# Patient Record
Sex: Male | Born: 1970 | Hispanic: No | Marital: Married | Smoking: Never smoker
Health system: Southern US, Community
[De-identification: ages and names within clinical notes are randomized; demographics above are authoritative.]

## PROBLEM LIST (undated history)

## (undated) HISTORY — PX: DG FOOT RIGHT COMPLETE (ARMC HX): HXRAD1522

## (undated) HISTORY — PX: APPENDECTOMY: SHX54

---

## 1998-11-27 ENCOUNTER — Inpatient Hospital Stay (HOSPITAL_COMMUNITY): Admission: EM | Admit: 1998-11-27 | Discharge: 1998-11-29 | Payer: Self-pay

## 2015-09-13 ENCOUNTER — Emergency Department (HOSPITAL_COMMUNITY): Payer: Worker's Compensation

## 2015-09-13 ENCOUNTER — Observation Stay (HOSPITAL_COMMUNITY)
Admission: EM | Admit: 2015-09-13 | Discharge: 2015-09-14 | Disposition: A | Discharge status: Home / Self Care | Payer: Worker's Compensation | Attending: Oncology | Admitting: Oncology

## 2015-09-13 DIAGNOSIS — T22091A Burn of unspecified degree of multiple sites of right shoulder and upper limb, except wrist and hand, initial encounter: Secondary | ICD-10-CM | POA: Insufficient documentation

## 2015-09-13 DIAGNOSIS — T754XXA Electrocution, initial encounter: Secondary | ICD-10-CM | POA: Diagnosis present

## 2015-09-13 DIAGNOSIS — T310 Burns involving less than 10% of body surface: Secondary | ICD-10-CM | POA: Insufficient documentation

## 2015-09-13 DIAGNOSIS — W868XXA Exposure to other electric current, initial encounter: Secondary | ICD-10-CM | POA: Insufficient documentation

## 2015-09-13 DIAGNOSIS — T22092A Burn of unspecified degree of multiple sites of left shoulder and upper limb, except wrist and hand, initial encounter: Secondary | ICD-10-CM | POA: Diagnosis not present

## 2015-09-13 DIAGNOSIS — T1490 Injury, unspecified: Secondary | ICD-10-CM | POA: Diagnosis not present

## 2015-09-13 DIAGNOSIS — T24092A Burn of unspecified degree of multiple sites of left lower limb, except ankle and foot, initial encounter: Secondary | ICD-10-CM | POA: Diagnosis not present

## 2015-09-13 DIAGNOSIS — W85XXXA Exposure to electric transmission lines, initial encounter: Secondary | ICD-10-CM | POA: Diagnosis not present

## 2015-09-13 DIAGNOSIS — E876 Hypokalemia: Secondary | ICD-10-CM | POA: Diagnosis not present

## 2015-09-13 DIAGNOSIS — T24091A Burn of unspecified degree of multiple sites of right lower limb, except ankle and foot, initial encounter: Secondary | ICD-10-CM | POA: Diagnosis not present

## 2015-09-13 DIAGNOSIS — T1490XA Injury, unspecified, initial encounter: Secondary | ICD-10-CM

## 2015-09-13 DIAGNOSIS — M25511 Pain in right shoulder: Secondary | ICD-10-CM | POA: Insufficient documentation

## 2015-09-13 DIAGNOSIS — IMO0002 Reserved for concepts with insufficient information to code with codable children: Secondary | ICD-10-CM

## 2015-09-13 DIAGNOSIS — Y93H3 Activity, building and construction: Secondary | ICD-10-CM | POA: Diagnosis not present

## 2015-09-13 DIAGNOSIS — R911 Solitary pulmonary nodule: Secondary | ICD-10-CM | POA: Diagnosis not present

## 2015-09-13 DIAGNOSIS — Z23 Encounter for immunization: Secondary | ICD-10-CM | POA: Diagnosis not present

## 2015-09-13 LAB — URINALYSIS, ROUTINE W REFLEX MICROSCOPIC
BILIRUBIN URINE: NEGATIVE
Glucose, UA: NEGATIVE mg/dL
HGB URINE DIPSTICK: NEGATIVE
KETONES UR: NEGATIVE mg/dL
Leukocytes, UA: NEGATIVE
NITRITE: NEGATIVE
PH: 7 (ref 5.0–8.0)
Protein, ur: NEGATIVE mg/dL
SPECIFIC GRAVITY, URINE: 1.043 — AB (ref 1.005–1.030)

## 2015-09-13 LAB — COMPREHENSIVE METABOLIC PANEL
ALT: 27 U/L (ref 17–63)
AST: 31 U/L (ref 15–41)
Albumin: 4.1 g/dL (ref 3.5–5.0)
Alkaline Phosphatase: 77 U/L (ref 38–126)
Anion gap: 11 (ref 5–15)
BILIRUBIN TOTAL: 0.6 mg/dL (ref 0.3–1.2)
BUN: 11 mg/dL (ref 6–20)
CHLORIDE: 105 mmol/L (ref 101–111)
CO2: 25 mmol/L (ref 22–32)
CREATININE: 0.73 mg/dL (ref 0.61–1.24)
Calcium: 9.4 mg/dL (ref 8.9–10.3)
Glucose, Bld: 131 mg/dL — ABNORMAL HIGH (ref 65–99)
POTASSIUM: 3.3 mmol/L — AB (ref 3.5–5.1)
Sodium: 141 mmol/L (ref 135–145)
TOTAL PROTEIN: 7.2 g/dL (ref 6.5–8.1)

## 2015-09-13 LAB — PROTIME-INR
INR: 0.94 (ref 0.00–1.49)
PROTHROMBIN TIME: 12.8 s (ref 11.6–15.2)

## 2015-09-13 LAB — CK TOTAL AND CKMB (NOT AT ARMC)
CK, MB: 3.4 ng/mL (ref 0.5–5.0)
Relative Index: 1.3 (ref 0.0–2.5)
Total CK: 272 U/L (ref 49–397)

## 2015-09-13 LAB — CBC
HEMATOCRIT: 43.3 % (ref 39.0–52.0)
Hemoglobin: 15.5 g/dL (ref 13.0–17.0)
MCH: 29.2 pg (ref 26.0–34.0)
MCHC: 35.8 g/dL (ref 30.0–36.0)
MCV: 81.5 fL (ref 78.0–100.0)
PLATELETS: 195 10*3/uL (ref 150–400)
RBC: 5.31 MIL/uL (ref 4.22–5.81)
RDW: 13.7 % (ref 11.5–15.5)
WBC: 8 10*3/uL (ref 4.0–10.5)

## 2015-09-13 LAB — I-STAT TROPONIN, ED: TROPONIN I, POC: 0 ng/mL (ref 0.00–0.08)

## 2015-09-13 LAB — I-STAT CHEM 8, ED
BUN: 12 mg/dL (ref 6–20)
CALCIUM ION: 1.15 mmol/L (ref 1.12–1.23)
CREATININE: 0.6 mg/dL — AB (ref 0.61–1.24)
Chloride: 104 mmol/L (ref 101–111)
GLUCOSE: 128 mg/dL — AB (ref 65–99)
HEMATOCRIT: 48 % (ref 39.0–52.0)
HEMOGLOBIN: 16.3 g/dL (ref 13.0–17.0)
Potassium: 3.1 mmol/L — ABNORMAL LOW (ref 3.5–5.1)
Sodium: 142 mmol/L (ref 135–145)
TCO2: 23 mmol/L (ref 0–100)

## 2015-09-13 LAB — ETHANOL: Alcohol, Ethyl (B): <5 mg/dL (ref <5)

## 2015-09-13 LAB — CDS SEROLOGY

## 2015-09-13 MED ORDER — OXYCODONE-ACETAMINOPHEN 5-325 MG PO TABS
1.0000 | ORAL_TABLET | Freq: Four times a day (QID) | ORAL | Status: DC | PRN
  Administered 2015-09-13: 2 via ORAL
  Filled 2015-09-13: qty 2

## 2015-09-13 MED ORDER — SODIUM CHLORIDE 0.9 % IV SOLN
INTRAVENOUS | Status: DC
  Administered 2015-09-13 – 2015-09-14 (×2): via INTRAVENOUS

## 2015-09-13 MED ORDER — TETANUS-DIPHTH-ACELL PERTUSSIS 5-2.5-18.5 LF-MCG/0.5 IM SUSP
0.5000 mL | Freq: Once | INTRAMUSCULAR | Status: AC
  Administered 2015-09-13: 0.5 mL via INTRAMUSCULAR

## 2015-09-13 MED ORDER — POTASSIUM CHLORIDE CRYS ER 20 MEQ PO TBCR
40.0000 meq | EXTENDED_RELEASE_TABLET | Freq: Once | ORAL | Status: AC
  Administered 2015-09-13: 40 meq via ORAL
  Filled 2015-09-13: qty 2

## 2015-09-13 MED ORDER — SILVER SULFADIAZINE 1 % EX CREA
TOPICAL_CREAM | Freq: Every day | CUTANEOUS | Status: DC
  Administered 2015-09-13: 1 via TOPICAL
  Filled 2015-09-13: qty 85

## 2015-09-13 MED ORDER — MORPHINE SULFATE (PF) 4 MG/ML IV SOLN
4.0000 mg | Freq: Once | INTRAVENOUS | Status: AC
  Administered 2015-09-13: 4 mg via INTRAVENOUS
  Filled 2015-09-13: qty 1

## 2015-09-13 MED ORDER — TETANUS-DIPHTHERIA TOXOIDS TD 5-2 LFU IM INJ
0.5000 mL | INJECTION | Freq: Once | INTRAMUSCULAR | Status: DC
  Filled 2015-09-13: qty 0.5

## 2015-09-13 MED ORDER — IOHEXOL 300 MG/ML  SOLN
100.0000 mL | Freq: Once | INTRAMUSCULAR | Status: AC | PRN
  Administered 2015-09-13: 100 mL via INTRAVENOUS

## 2015-09-13 MED ORDER — BACITRACIN ZINC 500 UNIT/GM EX OINT
TOPICAL_OINTMENT | Freq: Two times a day (BID) | CUTANEOUS | Status: DC
  Administered 2015-09-13 (×2): 1 via TOPICAL
  Administered 2015-09-14: 11:00:00 via TOPICAL
  Filled 2015-09-13 (×2): qty 28.35
  Filled 2015-09-13: qty 2.7
  Filled 2015-09-13 (×2): qty 28.35

## 2015-09-13 MED ORDER — ENOXAPARIN SODIUM 40 MG/0.4ML ~~LOC~~ SOLN
40.0000 mg | SUBCUTANEOUS | Status: DC
  Administered 2015-09-13: 40 mg via SUBCUTANEOUS
  Filled 2015-09-13: qty 0.4

## 2015-09-13 MED ORDER — POTASSIUM CHLORIDE 10 MEQ/100ML IV SOLN
10.0000 meq | Freq: Once | INTRAVENOUS | Status: AC
  Administered 2015-09-13: 10 meq via INTRAVENOUS
  Filled 2015-09-13: qty 100

## 2015-09-13 MED ORDER — OXYCODONE-ACETAMINOPHEN 5-325 MG PO TABS
2.0000 | ORAL_TABLET | Freq: Once | ORAL | Status: AC
  Administered 2015-09-13: 2 via ORAL
  Filled 2015-09-13: qty 2

## 2015-09-13 MED ORDER — ACETAMINOPHEN 650 MG RE SUPP: 650.0000 mg | Freq: Four times a day (QID) | RECTAL | Status: DC | PRN

## 2015-09-13 MED ORDER — ACETAMINOPHEN 325 MG PO TABS: 650.0000 mg | ORAL_TABLET | Freq: Four times a day (QID) | ORAL | Status: DC | PRN
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
## 2015-09-13 NOTE — ED Notes (Signed)
Attempted report 
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
## 2015-09-13 NOTE — ED Notes (Signed)
Regular meal tray ordered for pt @ 16:44

## 2015-09-13 NOTE — Progress Notes (Signed)
[facility] [place] Pt to be admitted to room 5w34.

## 2015-09-14 ENCOUNTER — Encounter (HOSPITAL_COMMUNITY): Payer: Self-pay | Admitting: *Deleted

## 2015-09-14 DIAGNOSIS — W85XXXA Exposure to electric transmission lines, initial encounter: Secondary | ICD-10-CM | POA: Diagnosis not present

## 2015-09-14 DIAGNOSIS — Y990 Civilian activity done for income or pay: Secondary | ICD-10-CM

## 2015-09-14 DIAGNOSIS — T310 Burns involving less than 10% of body surface: Secondary | ICD-10-CM | POA: Insufficient documentation

## 2015-09-14 DIAGNOSIS — IMO0002 Reserved for concepts with insufficient information to code with codable children: Secondary | ICD-10-CM | POA: Insufficient documentation

## 2015-09-14 DIAGNOSIS — Y93H3 Activity, building and construction: Secondary | ICD-10-CM

## 2015-09-14 DIAGNOSIS — T754XXA Electrocution, initial encounter: Secondary | ICD-10-CM

## 2015-09-14 LAB — CBC
HEMATOCRIT: 41.1 % (ref 39.0–52.0)
HEMOGLOBIN: 14.2 g/dL (ref 13.0–17.0)
MCH: 29.1 pg (ref 26.0–34.0)
MCHC: 34.5 g/dL (ref 30.0–36.0)
MCV: 84.2 fL (ref 78.0–100.0)
Platelets: 193 10*3/uL (ref 150–400)
RBC: 4.88 MIL/uL (ref 4.22–5.81)
RDW: 14.1 % (ref 11.5–15.5)
WBC: 7.6 10*3/uL (ref 4.0–10.5)

## 2015-09-14 LAB — CK: Total CK: 467 U/L — ABNORMAL HIGH (ref 49–397)

## 2015-09-14 LAB — BASIC METABOLIC PANEL
ANION GAP: 8 (ref 5–15)
BUN: 10 mg/dL (ref 6–20)
CALCIUM: 8.7 mg/dL — AB (ref 8.9–10.3)
CO2: 25 mmol/L (ref 22–32)
Chloride: 104 mmol/L (ref 101–111)
Creatinine, Ser: 0.73 mg/dL (ref 0.61–1.24)
GFR calc Af Amer: >60 mL/min (ref >60)
GFR calc non Af Amer: >60 mL/min (ref >60)
GLUCOSE: 100 mg/dL — AB (ref 65–99)
POTASSIUM: 4.4 mmol/L (ref 3.5–5.1)
Sodium: 137 mmol/L (ref 135–145)

## 2015-09-14 MED ORDER — SILVER SULFADIAZINE 1 % EX CREA
TOPICAL_CREAM | Freq: Two times a day (BID) | CUTANEOUS | Status: DC
  Administered 2015-09-14: 11:00:00 via TOPICAL
  Filled 2015-09-14: qty 85

## 2015-09-14 MED ORDER — BACITRACIN ZINC 500 UNIT/GM EX OINT: TOPICAL_OINTMENT | Freq: Two times a day (BID) | CUTANEOUS | Status: DC

## 2015-09-14 MED ORDER — OXYCODONE-ACETAMINOPHEN 5-325 MG PO TABS: 1.0000 | ORAL_TABLET | Freq: Four times a day (QID) | ORAL | Status: DC | PRN

## 2015-09-14 MED ORDER — SILVER SULFADIAZINE 1 % EX CREA: TOPICAL_CREAM | Freq: Two times a day (BID) | CUTANEOUS | Status: DC
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
## 2015-09-14 NOTE — Progress Notes (Signed)
Nsg Discharge Note  Admit Date:  09/13/2015 Discharge date: 09/14/2015   Jerome Potts to be D/C'd Home per MD order.  AVS completed.  Copy for chart, and copy for patient signed, and dated. Patient/caregiver able to verbalize understanding.  Discharge Medication:   Medication List    TAKE these medications        bacitracin ointment  Apply topically 2 (two) times daily.     oxyCODONE-acetaminophen 5-325 MG tablet  Commonly known as:  PERCOCET/ROXICET  Take 1-2 tablets by mouth every 6 (six) hours as needed for moderate pain or severe pain.     silver sulfADIAZINE 1 % cream  Commonly known as:  SILVADENE  Apply topically 2 (two) times daily.        Discharge Assessment: Filed Vitals:   09/14/15 0529 09/14/15 1237  BP: 103/61 128/82  Pulse: 51 62  Temp: 97.8 F (36.6 C) 98.1 F (36.7 C)  Resp: 18 19  Skin clean, dry and intact without evidence of skin break down, no evidence of skin tears noted. IV catheter discontinued intact. Site without signs and symptoms of complications - no redness or edema noted at insertion site, patient denies c/o pain - only slight tenderness at site.  Dressing with slight pressure applied.  D/c Instructions-Education: Discharge instructions given to patient/family with verbalized understanding. D/c education completed with patient/family including follow up instructions, medication list, d/c activities limitations if indicated, with other d/c instructions as indicated by MD - patient able to verbalize understanding, all questions fully answered. Patient instructed to return to ED, call 911, or call MD for any changes in condition.  Patient escorted via WC, and D/C home via private auto.  Jerome FlamingVicki L Victoriano Campion, RN 09/14/2015 3:52 PM

## 2015-09-14 NOTE — Discharge Instructions (Signed)
Apply Bacitracin ointment to your hand wound twice daily and cover with dry dressing.  Apply silver silvadene cream to the wounds on your toes twice daily and cover with dry dressing.   You may take Percocet 5-325 mg: 1-2 tablets by mouth every 6 hours as needed for pain.
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
## 2015-09-14 NOTE — Care Management Note (Signed)
Case Management Note  Patient Details  Name: Army Fossalex Ernesto Vandrunen MRN: [id number] Date of Birth: 01/13/1971  Subjective/Objective:    Date: 09/14/15 Spoke with patient at the bedside.  Introduced self as Sports coachcase manager and explained role in discharge planning and how to be reached.  Verified patient lives in town, alone with spouse.  Verified patient anticipates to go home with family,  at time of discharge and will have full-time supervision by family at this time to best of their knowledge. Patient denied needing help with their medication, he will be on silvadence cream which is $68 .00, bacitracin which is otc $2.00 and oxycodone which is $12 at [facility],  [facility]  Patient  is driven by family member to MD appointments.  Verified patient will follow up with internal medicine clinic.   Plan: CM will continue to follow for discharge planning and [facility].                 Action/Plan:   Expected Discharge Date:                  Expected Discharge Plan:  Home/Self Care  In-House Referral:     Discharge planning Services  CM Consult  Post Acute Care Choice:    Choice offered to:     DME Arranged:    DME Agency:     HH Arranged:    HH Agency:     Status of Service:  Completed, signed off  Medicare Important Message Given:    Date Medicare IM Given:    Medicare IM give by:    Date Additional Medicare IM Given:    Additional Medicare Important Message give by:     If discussed at Long Length of Stay Meetings, dates discussed:    Additional Comments:  Leone Havenaylor, Bosten Newstrom [place], RN 09/14/2015, 12:00 PM

## 2015-09-29 ENCOUNTER — Ambulatory Visit (INDEPENDENT_AMBULATORY_CARE_PROVIDER_SITE_OTHER): Payer: Worker's Compensation | Admitting: Internal Medicine

## 2015-09-29 VITALS — BP 144/77 | HR 70 | Temp 98.1°F | Ht 66.0 in | Wt 169.0 lb

## 2015-09-29 DIAGNOSIS — IMO0002 Reserved for concepts with insufficient information to code with codable children: Secondary | ICD-10-CM

## 2015-09-29 DIAGNOSIS — R911 Solitary pulmonary nodule: Secondary | ICD-10-CM | POA: Insufficient documentation

## 2015-09-29 DIAGNOSIS — T300 Burn of unspecified body region, unspecified degree: Secondary | ICD-10-CM

## 2015-09-29 DIAGNOSIS — W868XXD Exposure to other electric current, subsequent encounter: Secondary | ICD-10-CM

## 2015-09-29 MED ORDER — OXYCODONE-ACETAMINOPHEN 5-325 MG PO TABS: 1.0000 | ORAL_TABLET | Freq: Four times a day (QID) | ORAL | Status: DC | PRN

## 2015-09-29 NOTE — Assessment & Plan Note (Signed)
During admission, CT Chest revealed a solitary right middle lobe 4 mm pulmonary nodule. If the patient is at high risk for bronchogenic carcinoma, follow-up chest CT at 1 year is recommended. If the patient is at low risk, no follow-up is needed. Patient denies a history of tobacco use. Patient is likely at low risk and does not need further evaluation.  Plan: -Re-address smoking history at follow up visit and again at 1 year
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
## 2015-09-29 NOTE — Patient Instructions (Signed)
CONTINUE CURRENT MANAGEMENT OF THE WOUNDS. KEEP RIGHT FOOT ELEVATED IF POSSIBLE. I HAVE SENT IN MORE PAIN MEDICATION.  1. [facility] MONDAY JANUARY 9TH AT 9:30 AM  [address]  [place] [id number] --LOCATED ON [address]-- [phone]  2. [facility] AM [address] [place] [place] [place] [address] [phone]  Karle BarrQuemadura Por Electricidad (Electrical Burn) Judye BosUna quemadura por electricidad puede ocasionar daos tanto a la piel como a los tejidos profundos. Si una quemadura elctrica es menor y slo afecta la piel, puede tratarse como cualquier [place]ra quemadura. Si han daado otro tejidos ms profundos (msculos, nervios, vasos sanguneos), podr necesitar cuidados hospitalarios. Las quemaduras por electricidad pueden llegar a las zonas ms profundas del cuerpo, Biomedical engineerpero los signos de [place] People[place] Democratic Republicquemadura en la piel pueden ser mnimos. Las quemaduras profundas pueden afectar el corazn y otros rganos vitales. Si se sufre una lesin importante por electricidad, pueden daarse significativamente los msculos (rabdomilisis).Puede causar dolor muscular, fatiga y espasmos. Tambin puede ocurrir que la orina se vuelva [place]ainted Postoscura, debido a los productos que provienen de las lesiones musculares. Solicite atencin mdica de inmediato si presenta alguno de estos sntomas: Las lesiones por electricidad tambin pueden causar traumatismos debido a una cada o al golpe. El resultado puede ser un traumatismo importante en una extremidad o en la cabeza, y causar prdida de la conciencia.  El grado de la lesin se relaciona con el tipo de corriente (alterna [CA] o directa [CD]) y la cantidad de voltaje (bajo o alto). La mayor parte de las lesiones por electricidad que ocurren en el hogar son por bajo voltaje de CA, pero es importante que le informe a su mdico el modo en que ocurri para ayudarlo a Physicist, medicaldeterminar el tratamiento.La mayora de las  quemaduras por electricidad no ponen en peligro en la vida, pero puede interrumpir el ritmo cardaco o la respiracin. INSTRUCCIONES PARA EL CUIDADO EN EL HOGAR   Mantener el rea quemada limpia y [place] Islandsseca.  Utilice vendajes, cremas y pomadas para quemaduras slo cuando se lo indique el mdico. Los vendajes debern cambiarse una o dos veces al da o como se le indique.  Coloque en reposo la zona afectada y mantngala elevada para reducir la inflamacin y Chief Technology Officerel dolor.  Tome todos los medicamentos segn le indic su mdico. SOLICITE ATENCIN MDICA DE INMEDIATO SI:  Presenta fiebre, enrojecimiento o segrega pus.  Siente falta de aire o Journalist, newspaperdolor en el pecho.  Siente que se le adormece o debilita alguna de las extremidades.  Siente un dolor intenso o hinchazn en alguna de las extremidades.  La orina se vuelve muy oscura, y tiene ms dolor, fatiga y espasmos musculares. ASEGRESE DE QUE:   Comprende estas instrucciones.  Controlar su enfermedad.  Solicitar ayuda de inmediato si no mejora o si empeora.   Esta informacin no tiene Theme park managercomo fin reemplazar el consejo del mdico. Asegrese de hacerle al mdico cualquier pregunta que tenga.   Document Released: 09/09/2005 Document Revised: 12/02/2011 Elsevier Interactive Patient Education [employer]2016 Elsevier Inc.

## 2015-10-02 NOTE — Progress Notes (Signed)
Internal Medicine Clinic Attending  I saw and evaluated the patient.  I personally confirmed the key portions of the history and exam documented by Dr. Richardson and I reviewed pertinent patient test results.  The assessment, diagnosis, and plan were formulated together and I agree with the documentation in the resident's note. 

## 2015-10-05 ENCOUNTER — Ambulatory Visit: Payer: Self-pay | Admitting: Family Medicine

## 2015-10-13 ENCOUNTER — Ambulatory Visit: Payer: Self-pay | Admitting: Pulmonary Disease

## 2015-10-30 ENCOUNTER — Encounter (HOSPITAL_BASED_OUTPATIENT_CLINIC_OR_DEPARTMENT_OTHER): Payer: Self-pay
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
## 2019-12-20 ENCOUNTER — Ambulatory Visit: Payer: Self-pay | Attending: Internal Medicine

## 2019-12-20 DIAGNOSIS — Z23 Encounter for immunization: Secondary | ICD-10-CM

## 2019-12-20 NOTE — Progress Notes (Signed)
   Covid-19 Vaccination Clinic  Name:  Mattia Liford    MRN: [id number] DOB: 1971-04-24  12/20/2019  Mr. Ciccarelli was observed post Covid-19 immunization for 15 minutes without incident. He was provided with Vaccine Information Sheet and instruction to access the V-Safe system.   Mr. Vaden was instructed to call 911 with any severe reactions post vaccine: Marland Kitchen Difficulty breathing  . Swelling of face and throat  . A fast heartbeat  . A bad rash all over body  . Dizziness and weakness   Immunizations Administered    Name Date Dose VIS Date Route   Pfizer COVID-19 Vaccine 12/20/2019  8:37 AM 0.3 mL 09/03/2019 Intramuscular   Manufacturer: [employer], [employer]   Lot: [id number]   NDC: [address]-8

## 2020-01-12 ENCOUNTER — Ambulatory Visit: Payer: Self-pay | Attending: Internal Medicine

## 2020-01-12 DIAGNOSIS — Z23 Encounter for immunization: Secondary | ICD-10-CM

## 2020-01-12 NOTE — Progress Notes (Signed)
   Covid-19 Vaccination Clinic  Name:  Jerome Potts    MRN: [id number] DOB: 1971/03/26  01/12/2020  Jerome Potts was observed post Covid-19 immunization for 15 minutes without incident. He was provided with Vaccine Information Sheet and instruction to access the V-Safe system.   Jerome Potts was instructed to call 911 with any severe reactions post vaccine: Marland Kitchen Difficulty breathing  . Swelling of face and throat  . A fast heartbeat  . A bad rash all over body  . Dizziness and weakness   Immunizations Administered    Name Date Dose VIS Date Route   Pfizer COVID-19 Vaccine 01/12/2020  8:34 AM 0.3 mL 11/17/2018 Intramuscular   Manufacturer: [employer], [employer]   Lot: [id number]   NDC: [address]-1

## 2024-07-27 ENCOUNTER — Encounter: Payer: Self-pay | Admitting: Adult Health

## 2024-07-27 ENCOUNTER — Ambulatory Visit: Payer: Self-pay | Admitting: Adult Health

## 2024-07-27 VITALS — BP 116/58 | HR 74 | Temp 97.8°F | Ht 66.14 in | Wt 189.8 lb

## 2024-07-27 DIAGNOSIS — Z7689 Persons encountering health services in other specified circumstances: Secondary | ICD-10-CM

## 2024-07-27 DIAGNOSIS — Z125 Encounter for screening for malignant neoplasm of prostate: Secondary | ICD-10-CM

## 2024-07-27 DIAGNOSIS — Z1211 Encounter for screening for malignant neoplasm of colon: Secondary | ICD-10-CM

## 2024-07-27 DIAGNOSIS — E66811 Obesity, class 1: Secondary | ICD-10-CM | POA: Diagnosis not present

## 2024-07-27 DIAGNOSIS — Z113 Encounter for screening for infections with a predominantly sexual mode of transmission: Secondary | ICD-10-CM

## 2024-07-27 DIAGNOSIS — R1031 Right lower quadrant pain: Secondary | ICD-10-CM | POA: Diagnosis not present

## 2024-07-27 DIAGNOSIS — Z6830 Body mass index (BMI) 30.0-30.9, adult: Secondary | ICD-10-CM

## 2024-07-27 DIAGNOSIS — Z1212 Encounter for screening for malignant neoplasm of rectum: Secondary | ICD-10-CM
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
## 2024-07-28 LAB — CBC WITH DIFFERENTIAL/PLATELET
Absolute Lymphocytes: 2641 {cells}/uL (ref 850–3900)
Absolute Monocytes: 415 {cells}/uL (ref 200–950)
Basophils Absolute: 43 {cells}/uL (ref 0–200)
Basophils Relative: 0.7 %
Eosinophils Absolute: 110 {cells}/uL (ref 15–500)
Eosinophils Relative: 1.8 %
HCT: 46.1 % (ref 38.5–50.0)
Hemoglobin: 15.5 g/dL (ref 13.2–17.1)
MCH: 28.7 pg (ref 27.0–33.0)
MCHC: 33.6 g/dL (ref 32.0–36.0)
MCV: 85.2 fL (ref 80.0–100.0)
MPV: 10.9 fL (ref 7.5–12.5)
Monocytes Relative: 6.8 %
Neutro Abs: 2891 {cells}/uL (ref 1500–7800)
Neutrophils Relative %: 47.4 %
Platelets: 206 Thousand/uL (ref 140–400)
RBC: 5.41 Million/uL (ref 4.20–5.80)
RDW: 14 % (ref 11.0–15.0)
Total Lymphocyte: 43.3 %
WBC: 6.1 Thousand/uL (ref 3.8–10.8)

## 2024-07-28 LAB — COMPREHENSIVE METABOLIC PANEL WITH GFR
AG Ratio: 1.9 (calc) (ref 1.0–2.5)
ALT: 22 U/L (ref 9–46)
AST: 23 U/L (ref 10–35)
Albumin: 4.6 g/dL (ref 3.6–5.1)
Alkaline phosphatase (APISO): 76 U/L (ref 35–144)
BUN/Creatinine Ratio: 17 (calc) (ref 6–22)
BUN: 12 mg/dL (ref 7–25)
CO2: 25 mmol/L (ref 20–32)
Calcium: 9.3 mg/dL (ref 8.6–10.3)
Chloride: 105 mmol/L (ref 98–110)
Creat: 0.69 mg/dL — ABNORMAL LOW (ref 0.70–1.30)
Globulin: 2.4 g/dL (ref 1.9–3.7)
Glucose, Bld: 99 mg/dL (ref 65–99)
Potassium: 4.2 mmol/L (ref 3.5–5.3)
Sodium: 140 mmol/L (ref 135–146)
Total Bilirubin: 0.5 mg/dL (ref 0.2–1.2)
Total Protein: 7 g/dL (ref 6.1–8.1)
eGFR: 112 mL/min/1.73m2 (ref >=60)

## 2024-07-28 LAB — LIPID PANEL
Cholesterol: 279 mg/dL — ABNORMAL HIGH (ref <200)
HDL: 72 mg/dL (ref >=40)
LDL Cholesterol (Calc): 178 mg/dL — ABNORMAL HIGH
Non-HDL Cholesterol (Calc): 207 mg/dL — ABNORMAL HIGH (ref <130)
Total CHOL/HDL Ratio: 3.9 (calc) (ref <5.0)
Triglycerides: 145 mg/dL (ref <150)

## 2024-07-28 LAB — HIV ANTIBODY (ROUTINE TESTING W REFLEX)
HIV 1&2 Ab, 4th Generation: NONREACTIVE
HIV FINAL INTERPRETATION: NEGATIVE

## 2024-07-28 LAB — PSA: PSA: 1.52 ng/mL (ref <=4.00)

## 2024-07-28 LAB — HEPATITIS C ANTIBODY: Hepatitis C Ab: NONREACTIVE

## 2024-07-30 ENCOUNTER — Ambulatory Visit: Payer: Self-pay | Admitting: Adult Health

## 2024-07-30 NOTE — Progress Notes (Signed)
-   cholesterol 279, elevated -  LDL 178, elevated, recommending to exercise for at least 150 minutes/week, low fat diet and will recheck in 3 months. -  No anemia -  electrolytes, liver enzymes, kidney function, PSA normal -   Hep C and HIV antibody negative

## 2024-08-09 ENCOUNTER — Ambulatory Visit: Admitting: Adult Health

## 2024-08-09 ENCOUNTER — Telehealth: Payer: Self-pay | Admitting: *Deleted

## 2024-08-09 ENCOUNTER — Encounter: Payer: Self-pay | Admitting: Adult Health

## 2024-08-09 ENCOUNTER — Other Ambulatory Visit

## 2024-08-09 VITALS — BP 136/92 | HR 67 | Temp 97.5°F | Ht 66.14 in | Wt 192.2 lb

## 2024-08-09 DIAGNOSIS — E782 Mixed hyperlipidemia: Secondary | ICD-10-CM | POA: Diagnosis not present

## 2024-08-09 DIAGNOSIS — R1031 Right lower quadrant pain: Secondary | ICD-10-CM | POA: Diagnosis not present

## 2024-08-09 DIAGNOSIS — E66811 Obesity, class 1: Secondary | ICD-10-CM | POA: Diagnosis not present

## 2024-08-09 DIAGNOSIS — Z6830 Body mass index (BMI) 30.0-30.9, adult: Secondary | ICD-10-CM | POA: Diagnosis not present

## 2024-08-09 NOTE — Progress Notes (Addendum)
 The [facility] clinic  Provider:  Jereld Serum DNP  Code Status:  Full Code  Goals of Care:     07/27/2024    9:19 AM  Advanced Directives  Does Patient Have a Medical Advance Directive? No  Would patient like information on creating a medical advance directive? No - Patient declined     Chief Complaint  Patient presents with   Medical Management of Chronic Issues    2 week followup    Discussed the use of AI scribe software for clinical note transcription with the patient, who gave verbal consent to proceed.  HPI: Patient is a 53 y.o. male seen today for a 2-week follow up of chronic medical issues.  Recent lab results from July 27, 2024, show a total cholesterol level of 279 mg/dL, LDL of 821 mg/dL, and triglycerides of 854 mg/dL. He is considering lifestyle modifications, including exercise and a low-fat diet, to manage his cholesterol levels. His BMI is 30.89. He has maintained a stable weight of approximately 192 pounds for the past ten years.  He experienced abdominal pain about a month ago but reports no current abdominal pain. He has not yet completed the abdominal x-ray that was previously ordered. He has regular bowel movements, twice a day. He has a history of appendectomy and notes some abdominal distension post-surgery.  No smoking or alcohol use.    History reviewed. No pertinent past medical history.  Past Surgical History:  Procedure Laterality Date   APPENDECTOMY     DG FOOT RIGHT COMPLETE ([facility] HX) Right     No Known Allergies  No outpatient encounter medications on file as of 08/09/2024.   No facility-administered encounter medications on file as of 08/09/2024.    Review of Systems:  Review of Systems  Constitutional:  Negative for activity change, appetite change and fever.  HENT:  Negative for sore throat.   Eyes: Negative.   Cardiovascular:  Negative for chest pain and leg swelling.  Gastrointestinal:  Positive for abdominal pain.  Negative for abdominal distention, diarrhea and vomiting.  Genitourinary:  Negative for dysuria, frequency and urgency.  Skin:  Negative for color change.  Neurological:  Negative for dizziness and headaches.  Psychiatric/Behavioral:  Negative for behavioral problems and sleep disturbance. The patient is not nervous/anxious.     Health Maintenance  Topic Date Due   Colonoscopy  Never done   Pneumococcal Vaccine: 50+ Years (1 of 1 - PCV) Never done   Zoster Vaccines- Shingrix (1 of 2) 11/15/2022   Hepatitis B Vaccines 19-59 Average Risk (2 of 3 - 19+ 3-dose series) 11/14/2023   COVID-19 Vaccine (7 - 2025-26 season) 08/12/2024 (Originally 05/24/2024)   Influenza Vaccine  12/21/2024 (Originally 04/23/2024)   DTaP/Tdap/Td (4 - Td or Tdap) 10/16/2033   Hepatitis C Screening  Completed   HIV Screening  Completed   HPV VACCINES  Aged Out   Meningococcal B Vaccine  Aged Out    Physical Exam: Vitals:   08/09/24 0854  BP: (!) 136/92  Pulse: 67  Temp: (!) 97.5 F (36.4 C)  TempSrc: Temporal  SpO2: 96%  Weight: 192 lb 3.2 oz (87.2 kg)  Height: 5' 6.14 (1.68 m)   Body mass index is 30.89 kg/m. Physical Exam Constitutional:      General: He is not in acute distress.    Appearance: He is obese.  HENT:     Head: Normocephalic and atraumatic.     Mouth/Throat:     Mouth: Mucous membranes are moist.  Eyes:     Conjunctiva/sclera: Conjunctivae normal.  Cardiovascular:     Rate and Rhythm: Normal rate and regular rhythm.     Pulses: Normal pulses.     Heart sounds: Normal heart sounds.  Pulmonary:     Effort: Pulmonary effort is normal.     Breath sounds: Normal breath sounds.  Abdominal:     General: Bowel sounds are normal.     Palpations: Abdomen is soft.  Musculoskeletal:        General: No swelling. Normal range of motion.     Cervical back: Normal range of motion.  Skin:    General: Skin is warm and dry.  Neurological:     General: No focal deficit present.     Mental  Status: He is alert and oriented to person, place, and time.  Psychiatric:        Mood and Affect: Mood normal.        Behavior: Behavior normal.        Thought Content: Thought content normal.        Judgment: Judgment normal.     Labs reviewed: Basic Metabolic Panel: Recent Labs    07/27/24 1016  NA 140  K 4.2  CL 105  CO2 25  GLUCOSE 99  BUN 12  CREATININE 0.69*  CALCIUM 9.3   Liver Function Tests: Recent Labs    07/27/24 1016  AST 23  ALT 22  BILITOT 0.5  PROT 7.0   No results for input(s): LIPASE, AMYLASE in the last 8760 hours. No results for input(s): AMMONIA in the last 8760 hours. CBC: Recent Labs    07/27/24 1016  WBC 6.1  NEUTROABS 2,891  HGB 15.5  HCT 46.1  MCV 85.2  PLT 206   Lipid Panel: Recent Labs    07/27/24 1016  CHOL 279*  HDL 72  LDLCALC 178*  TRIG 145  CHOLHDL 3.9   No results found for: HGBA1C  Procedures since last visit: No results found.  Assessment/Plan  1. Mixed hyperlipidemia (Primary) Lab Results  Component Value Date   CHOL 279 (H) 07/27/2024   HDL 72 07/27/2024   LDLCALC 178 (H) 07/27/2024   TRIG 145 07/27/2024   CHOLHDL 3.9 07/27/2024    -  Cholesterol 279 mg/dL, LDL 821 mg/dL, triglycerides 854 mg/dL. Prefers lifestyle changes over medication. - Engage in at least 150 minutes of exercise per week. - Adopt a low-fat diet. - Recheck lipid panel in 3 months after fasting from midnight.  2. Class 1 obesity with body mass index (BMI) of 30.0 to 30.9 in adult, unspecified obesity type, unspecified whether serious comorbidity present -  BMI 30.89, weight 192 lbs, height 5'3. Ideal weight 140 lbs, requires 52 lbs weight loss. Weight stable for 10 years. - Aim for a weight loss of at least 5 lbs in 3 months. - Continue weight loss efforts to reach ideal body weight.  3. Right lower quadrant abdominal pain -  Intermittent abdominal pain, last experienced one month ago. Regular bowel movements. Previous  appendectomy. - Complete abdominal x-ray at [facility] before 4 PM. - If x-ray inconclusive and pain persists, consider further diagnostic evaluation.     Labs/tests ordered:   None   Return in about 3 months (around 11/09/2024).  Nyzir Dubois Medina-Vargas, NP

## 2024-08-09 NOTE — Addendum Note (Signed)
 Addended by: PHYLLIS MUTTER C on: 08/09/2024 05:11 PM   Modules accepted: Orders

## 2024-08-09 NOTE — Telephone Encounter (Signed)
 I have sent order to [facility] since [facility] is not accepting your insurance. Pls go there for your abdominal x-ray.

## 2024-08-09 NOTE — Telephone Encounter (Unsigned)
 Copied from CRM #[id number]. Topic: Clinical - Request for Lab/Test Order >> Aug 09, 2024  2:42 PM Antonio H wrote: Reason for CRM: Patient had xray order sent to [facility], stated the location the xray was sent to does not accept his insurance([employer]) and he needs order to be sent to another location. [phone] is call back number

## 2024-08-10 NOTE — Telephone Encounter (Signed)
 Patient notified and agreed.

## 2024-11-09 ENCOUNTER — Ambulatory Visit: Admitting: Adult Health
